# Patient Record
Sex: Female | Born: 1995 | Hispanic: Yes | Smoking: Never smoker
Health system: Southern US, Community
[De-identification: ages and names within clinical notes are randomized; demographics above are authoritative.]

## PROBLEM LIST (undated history)

## (undated) DIAGNOSIS — Z789 Other specified health status: Secondary | ICD-10-CM

## (undated) HISTORY — PX: NO PAST SURGERIES: SHX2092

---

## 2015-08-19 ENCOUNTER — Other Ambulatory Visit (HOSPITAL_COMMUNITY): Payer: Self-pay | Admitting: Nurse Practitioner

## 2015-08-19 DIAGNOSIS — Z3682 Encounter for antenatal screening for nuchal translucency: Secondary | ICD-10-CM

## 2015-08-19 DIAGNOSIS — Z3A12 12 weeks gestation of pregnancy: Secondary | ICD-10-CM

## 2015-09-14 ENCOUNTER — Ambulatory Visit (HOSPITAL_COMMUNITY)
Admission: RE | Admit: 2015-09-14 | Discharge: 2015-09-14 | Disposition: A | Discharge status: Home / Self Care | Payer: Medicaid Other | Source: Ambulatory Visit | Attending: Nurse Practitioner | Admitting: Nurse Practitioner

## 2015-09-14 ENCOUNTER — Encounter (HOSPITAL_COMMUNITY): Payer: Self-pay

## 2015-09-14 VITALS — BP 115/60 | HR 65 | Wt 119.6 lb

## 2015-09-14 DIAGNOSIS — Z3682 Encounter for antenatal screening for nuchal translucency: Secondary | ICD-10-CM

## 2015-09-14 DIAGNOSIS — Z36 Encounter for antenatal screening of mother: Secondary | ICD-10-CM | POA: Diagnosis present

## 2015-09-14 DIAGNOSIS — Z3689 Encounter for other specified antenatal screening: Secondary | ICD-10-CM

## 2015-09-14 DIAGNOSIS — Z3A12 12 weeks gestation of pregnancy: Secondary | ICD-10-CM | POA: Diagnosis not present

## 2015-09-22 ENCOUNTER — Other Ambulatory Visit (HOSPITAL_COMMUNITY): Payer: Self-pay | Admitting: Nurse Practitioner

## 2015-10-19 ENCOUNTER — Ambulatory Visit (HOSPITAL_COMMUNITY): Admission: RE | Admit: 2015-10-19 | Payer: Medicaid Other | Source: Ambulatory Visit

## 2016-02-20 ENCOUNTER — Inpatient Hospital Stay (HOSPITAL_COMMUNITY): Payer: Medicaid Other | Admitting: Anesthesiology

## 2016-02-20 ENCOUNTER — Encounter (HOSPITAL_COMMUNITY): Payer: Self-pay

## 2016-02-20 ENCOUNTER — Inpatient Hospital Stay (HOSPITAL_COMMUNITY)
Admission: AD | Admit: 2016-02-20 | Discharge: 2016-02-22 | DRG: 774 | Disposition: A | Discharge status: Home / Self Care | Payer: Medicaid Other | Source: Ambulatory Visit | Attending: Obstetrics and Gynecology | Admitting: Obstetrics and Gynecology

## 2016-02-20 DIAGNOSIS — Z3A35 35 weeks gestation of pregnancy: Secondary | ICD-10-CM

## 2016-02-20 DIAGNOSIS — O458X3 Other premature separation of placenta, third trimester: Secondary | ICD-10-CM | POA: Diagnosis present

## 2016-02-20 DIAGNOSIS — Z23 Encounter for immunization: Secondary | ICD-10-CM | POA: Diagnosis not present

## 2016-02-20 HISTORY — DX: Other specified health status: Z78.9

## 2016-02-20 LAB — TYPE AND SCREEN
ABO/RH(D): O POS
Antibody Screen: NEGATIVE

## 2016-02-20 LAB — ABO/RH: ABO/RH(D): O POS

## 2016-02-20 LAB — CBC
HEMATOCRIT: 34 % — AB (ref 36.0–46.0)
Hemoglobin: 11.3 g/dL — ABNORMAL LOW (ref 12.0–15.0)
MCH: 27.8 pg (ref 26.0–34.0)
MCHC: 33.2 g/dL (ref 30.0–36.0)
MCV: 83.7 fL (ref 78.0–100.0)
PLATELETS: 324 10*3/uL (ref 150–400)
RBC: 4.06 MIL/uL (ref 3.87–5.11)
RDW: 13.5 % (ref 11.5–15.5)
WBC: 12.9 10*3/uL — ABNORMAL HIGH (ref 4.0–10.5)

## 2016-02-20 LAB — GROUP B STREP BY PCR: GROUP B STREP BY PCR: NEGATIVE

## 2016-02-20 MED ORDER — OXYTOCIN BOLUS FROM INFUSION
500.0000 mL | INTRAVENOUS | Status: DC
  Administered 2016-02-20: 500 mL via INTRAVENOUS

## 2016-02-20 MED ORDER — PHENYLEPHRINE 40 MCG/ML (10ML) SYRINGE FOR IV PUSH (FOR BLOOD PRESSURE SUPPORT): 80.0000 ug | PREFILLED_SYRINGE | INTRAVENOUS | Status: DC | PRN

## 2016-02-20 MED ORDER — ONDANSETRON HCL 4 MG/2ML IJ SOLN: 4.0000 mg | Freq: Four times a day (QID) | INTRAMUSCULAR | Status: DC | PRN

## 2016-02-20 MED ORDER — LANOLIN HYDROUS EX OINT: TOPICAL_OINTMENT | CUTANEOUS | Status: DC | PRN

## 2016-02-20 MED ORDER — OXYCODONE-ACETAMINOPHEN 5-325 MG PO TABS: 1.0000 | ORAL_TABLET | ORAL | Status: DC | PRN

## 2016-02-20 MED ORDER — PHENYLEPHRINE 40 MCG/ML (10ML) SYRINGE FOR IV PUSH (FOR BLOOD PRESSURE SUPPORT)
PREFILLED_SYRINGE | INTRAVENOUS | Status: AC
  Filled 2016-02-20: qty 10

## 2016-02-20 MED ORDER — ONDANSETRON HCL 4 MG/2ML IJ SOLN: 4.0000 mg | INTRAMUSCULAR | Status: DC | PRN

## 2016-02-20 MED ORDER — SODIUM CHLORIDE 0.9 % IV SOLN
2.0000 g | Freq: Once | INTRAVENOUS | Status: AC
  Administered 2016-02-20: 2 g via INTRAVENOUS
  Filled 2016-02-20: qty 2000

## 2016-02-20 MED ORDER — HYDROXYZINE HCL 50 MG PO TABS
50.0000 mg | ORAL_TABLET | Freq: Four times a day (QID) | ORAL | Status: DC | PRN
  Filled 2016-02-20: qty 1

## 2016-02-20 MED ORDER — FENTANYL CITRATE (PF) 100 MCG/2ML IJ SOLN
50.0000 ug | INTRAMUSCULAR | Status: DC | PRN
  Administered 2016-02-20: 100 ug via INTRAVENOUS
  Filled 2016-02-20: qty 2

## 2016-02-20 MED ORDER — SODIUM CHLORIDE 0.9% FLUSH: 3.0000 mL | INTRAVENOUS | Status: DC | PRN

## 2016-02-20 MED ORDER — OXYTOCIN 10 UNIT/ML IJ SOLN
2.5000 [IU]/h | INTRAVENOUS | Status: DC
  Administered 2016-02-20: 2.5 [IU]/h via INTRAVENOUS
  Filled 2016-02-20: qty 10

## 2016-02-20 MED ORDER — LIDOCAINE HCL (PF) 1 % IJ SOLN
INTRAMUSCULAR | Status: DC | PRN
  Administered 2016-02-20: 2 mL
  Administered 2016-02-20: 3 mL
  Administered 2016-02-20: 5 mL

## 2016-02-20 MED ORDER — EPHEDRINE 5 MG/ML INJ: 10.0000 mg | INTRAVENOUS | Status: DC | PRN

## 2016-02-20 MED ORDER — VITAMIN K1 1 MG/0.5ML IJ SOLN
INTRAMUSCULAR | Status: AC
  Filled 2016-02-20: qty 0.5

## 2016-02-20 MED ORDER — METHYLERGONOVINE MALEATE 0.2 MG PO TABS
0.2000 mg | ORAL_TABLET | Freq: Four times a day (QID) | ORAL | Status: AC
  Administered 2016-02-20 – 2016-02-21 (×4): 0.2 mg via ORAL
  Filled 2016-02-20 (×4): qty 1

## 2016-02-20 MED ORDER — SODIUM CHLORIDE 0.9 % IV SOLN: 250.0000 mL | INTRAVENOUS | Status: DC | PRN

## 2016-02-20 MED ORDER — DIPHENHYDRAMINE HCL 25 MG PO CAPS: 25.0000 mg | ORAL_CAPSULE | Freq: Four times a day (QID) | ORAL | Status: DC | PRN

## 2016-02-20 MED ORDER — SODIUM CHLORIDE 0.9% FLUSH: 3.0000 mL | Freq: Two times a day (BID) | INTRAVENOUS | Status: DC

## 2016-02-20 MED ORDER — LACTATED RINGERS IV SOLN: 500.0000 mL | Freq: Once | INTRAVENOUS | Status: DC

## 2016-02-20 MED ORDER — EPHEDRINE 5 MG/ML INJ
10.0000 mg | INTRAVENOUS | Status: DC | PRN
  Filled 2016-02-20: qty 2

## 2016-02-20 MED ORDER — DIBUCAINE 1 % RE OINT: 1.0000 "application " | TOPICAL_OINTMENT | RECTAL | Status: DC | PRN

## 2016-02-20 MED ORDER — ZOLPIDEM TARTRATE 5 MG PO TABS: 5.0000 mg | ORAL_TABLET | Freq: Every evening | ORAL | Status: DC | PRN

## 2016-02-20 MED ORDER — FENTANYL 2.5 MCG/ML BUPIVACAINE 1/10 % EPIDURAL INFUSION (WH - ANES)
14.0000 mL/h | INTRAMUSCULAR | Status: DC | PRN
  Administered 2016-02-20 (×2): 14 mL/h via EPIDURAL

## 2016-02-20 MED ORDER — SIMETHICONE 80 MG PO CHEW: 80.0000 mg | CHEWABLE_TABLET | ORAL | Status: DC | PRN

## 2016-02-20 MED ORDER — DIPHENHYDRAMINE HCL 50 MG/ML IJ SOLN: 12.5000 mg | INTRAMUSCULAR | Status: DC | PRN

## 2016-02-20 MED ORDER — BENZOCAINE-MENTHOL 20-0.5 % EX AERO: 1.0000 "application " | INHALATION_SPRAY | CUTANEOUS | Status: DC | PRN

## 2016-02-20 MED ORDER — OXYTOCIN 10 UNIT/ML IJ SOLN: 10.0000 [IU] | Freq: Once | INTRAMUSCULAR | Status: DC

## 2016-02-20 MED ORDER — SENNOSIDES-DOCUSATE SODIUM 8.6-50 MG PO TABS
2.0000 | ORAL_TABLET | ORAL | Status: DC
  Administered 2016-02-20 – 2016-02-21 (×2): 2 via ORAL
  Filled 2016-02-20 (×2): qty 2

## 2016-02-20 MED ORDER — BETAMETHASONE SOD PHOS & ACET 6 (3-3) MG/ML IJ SUSP
12.0000 mg | Freq: Once | INTRAMUSCULAR | Status: DC
Start: 2016-02-21 — End: 2016-02-20
  Filled 2016-02-20: qty 2

## 2016-02-20 MED ORDER — FENTANYL 2.5 MCG/ML BUPIVACAINE 1/10 % EPIDURAL INFUSION (WH - ANES)
INTRAMUSCULAR | Status: AC
  Filled 2016-02-20: qty 125

## 2016-02-20 MED ORDER — OXYCODONE-ACETAMINOPHEN 5-325 MG PO TABS: 2.0000 | ORAL_TABLET | ORAL | Status: DC | PRN

## 2016-02-20 MED ORDER — TETANUS-DIPHTH-ACELL PERTUSSIS 5-2.5-18.5 LF-MCG/0.5 IM SUSP: 0.5000 mL | Freq: Once | INTRAMUSCULAR | Status: DC

## 2016-02-20 MED ORDER — CITRIC ACID-SODIUM CITRATE 334-500 MG/5ML PO SOLN: 30.0000 mL | ORAL | Status: DC | PRN

## 2016-02-20 MED ORDER — LIDOCAINE HCL (PF) 1 % IJ SOLN
30.0000 mL | INTRAMUSCULAR | Status: DC | PRN
  Filled 2016-02-20: qty 30

## 2016-02-20 MED ORDER — ACETAMINOPHEN 325 MG PO TABS: 650.0000 mg | ORAL_TABLET | ORAL | Status: DC | PRN

## 2016-02-20 MED ORDER — WITCH HAZEL-GLYCERIN EX PADS: 1.0000 "application " | MEDICATED_PAD | CUTANEOUS | Status: DC | PRN

## 2016-02-20 MED ORDER — SODIUM CHLORIDE 0.9 % IV SOLN
1.0000 g | INTRAVENOUS | Status: DC
  Administered 2016-02-20: 1 g via INTRAVENOUS
  Filled 2016-02-20 (×3): qty 1000

## 2016-02-20 MED ORDER — PRENATAL MULTIVITAMIN CH
1.0000 | ORAL_TABLET | Freq: Every day | ORAL | Status: DC
  Administered 2016-02-21 – 2016-02-22 (×2): 1 via ORAL
  Filled 2016-02-20 (×2): qty 1

## 2016-02-20 MED ORDER — BETAMETHASONE SOD PHOS & ACET 6 (3-3) MG/ML IJ SUSP
12.0000 mg | Freq: Once | INTRAMUSCULAR | Status: AC
  Administered 2016-02-20: 12 mg via INTRAMUSCULAR
  Filled 2016-02-20: qty 2

## 2016-02-20 MED ORDER — LACTATED RINGERS IV SOLN
INTRAVENOUS | Status: DC
  Administered 2016-02-20: 125 mL/h via INTRAVENOUS
  Administered 2016-02-20: 11:00:00 via INTRAVENOUS

## 2016-02-20 MED ORDER — IBUPROFEN 600 MG PO TABS
600.0000 mg | ORAL_TABLET | Freq: Four times a day (QID) | ORAL | Status: DC
  Administered 2016-02-20 – 2016-02-22 (×7): 600 mg via ORAL
  Filled 2016-02-20 (×7): qty 1

## 2016-02-20 MED ORDER — LACTATED RINGERS IV SOLN: 500.0000 mL | INTRAVENOUS | Status: DC | PRN

## 2016-02-20 MED ORDER — ONDANSETRON HCL 4 MG PO TABS: 4.0000 mg | ORAL_TABLET | ORAL | Status: DC | PRN

## 2016-02-20 MED ORDER — PHENYLEPHRINE 40 MCG/ML (10ML) SYRINGE FOR IV PUSH (FOR BLOOD PRESSURE SUPPORT)
80.0000 ug | PREFILLED_SYRINGE | INTRAVENOUS | Status: DC | PRN
  Filled 2016-02-20: qty 2

## 2016-02-20 NOTE — Anesthesia Procedure Notes (Signed)
Epidural Patient location during procedure: OB  Staffing Anesthesiologist: Marcene DuosFITZGERALD, Marilyn Johnson Performed by: anesthesiologist   Preanesthetic Checklist Completed: patient identified, site marked, surgical consent, pre-op evaluation, timeout performed, IV checked, risks and benefits discussed and monitors and equipment checked  Epidural Patient position: sitting Prep: site prepped and draped and DuraPrep Patient monitoring: continuous pulse ox and blood pressure Approach: midline Location: L4-L5 Injection technique: LOR saline  Needle:  Needle type: Tuohy  Needle gauge: 17 G Needle length: 9 cm and 9 Needle insertion depth: 6 cm Catheter type: closed end flexible Catheter size: 19 Gauge Catheter at skin depth: 12 (11cm initially at the skin. Moved to 12cm after pt laid in left lat decubitus position.) cm Test dose: negative  Assessment Events: blood not aspirated, injection not painful, no injection resistance, negative IV test and no paresthesia

## 2016-02-20 NOTE — Progress Notes (Signed)
Patient ID: Marilyn Johnson, female   DOB: [birth year], 20 y.o.   MRN: [id number] Operative Delivery Note At 6:42 PM a viable and healthy female was delivered via .  Presentation: vertex; Position: Occiput,, Posterior; Station: +2.  Verbal consent: obtained from patient.  Risks and benefits discussed in detail.  Risks include, but are not limited to the risks of anesthesia, bleeding, infection, damage to maternal tissues, fetal cephalhematoma.  There is also the risk of inability to effect vaginal delivery of the head, or shoulder dystocia that cannot be resolved by established maneuvers, leading to the need for emergency cesarean section.  APGAR: 7/9, ; weight  .   Placenta status: Intact, Spontaneous.   Cord: 3 vessels with the following complications: None.  Cord pH: not done   Anesthesia: Epidural  Instruments: Kiwi tried initially , wouldn't seal well, so Bell suction cup used without popoff, over 2 contractions, resulting in delivery to crowning position, then Cup removed and pt spontaneously expelled the vertex the last bit while the fetal vertex rotated and the chin delivered over the perineal body. The head then rotated back to occiput posterior, shoulder dystocia which was relieved by identifying the left shoulder which was anterior and could be extracted beneath the symphysis pubis without difficulty and then the rest the baby delivered.. There was a greater than normal amount of old blood covering the baby's face during the delivery, suggestive of a partial abruption.\The infant was passed to waiting pediatricians. There had been a nuchal cord 1. Pediatrician's notes for further details. Apgars were 7 and 9 and the baby did well, and there was returned to the mother for skin to skin care.\ Placenta delivered easily [place] presentation with the leading edge showing a small area of attached old clot consistent with marginal abruption total blood loss was 350 cc or less. Routine neonatal labs  were drawn blood gas was not considered necessary and declined by pediatrics   Episiotomy:   Lacerations:  none Suture Repair:  Est. Blood Loss (mL):  325-350  Mom to postpartum.  Baby to Nursery.  Marilyn Johnson V 02/20/2016, 6:55 PM

## 2016-02-20 NOTE — H&P (Signed)
Marilyn DienerCristian Johnson is a 20 y.o. G3P1101 @ 35.6 wks  female presenting for contractions that started last night and got progressively stronger this morning. . Maternal Medical History:  Reason for admission: Contractions.   Contractions: Onset was 13-24 hours ago.   Perceived severity is moderate.    Fetal activity: Perceived fetal activity is normal.   Last perceived fetal movement was within the past hour.    Prenatal complications: no prenatal complications Prenatal Complications - Diabetes: none.    OB History    Gravida Para Term Preterm AB TAB SAB Ectopic Multiple Living   3 1 1  0 1 0 0 0 0 2     Past Medical History  Diagnosis Date  . Medical history non-contributory    Past Surgical History  Procedure Laterality Date  . No past surgeries     Family History: family history is negative for Diabetes, Heart disease, and Cancer. Social History:  reports that she has never smoked. She has never used smokeless tobacco. She reports that she does not drink alcohol or use illicit drugs.   Prenatal Transfer Tool  Maternal Diabetes: No Genetic Screening: Normal Maternal Ultrasounds/Referrals: Normal Fetal Ultrasounds or other Referrals:  None Maternal Substance Abuse:  No Significant Maternal Medications:  None Significant Maternal Lab Results:  None Other Comments:  None  Review of Systems  Constitutional: Negative.   HENT: Negative.   Eyes: Negative.   Respiratory: Negative.   Cardiovascular: Negative.   Gastrointestinal: Positive for abdominal pain.  Genitourinary: Negative.   Musculoskeletal: Negative.   Skin: Negative.   Neurological: Negative.   Endo/Heme/Allergies: Negative.   Psychiatric/Behavioral: Negative.     Dilation: 3.5 Effacement (%): 100 Station: -1 Exam by:: [email or web address],rnc Blood pressure 114/79, pulse 73, temperature 98.3 F (36.8 C), temperature source Oral, resp. rate 18, height 4\' 10"  (1.473 m), weight 130 lb (58.968 kg). Maternal Exam:   Uterine Assessment: Contraction strength is mild.  Contraction frequency is regular.   Abdomen: Patient reports no abdominal tenderness. Fetal presentation: vertex  Introitus: Normal vulva. Pelvis: adequate for delivery.   Cervix: Cervix evaluated by digital exam.     Fetal Exam Fetal Monitor Review: Mode: ultrasound.   Variability: moderate (6-25 bpm).   Pattern: no accelerations.    Fetal State Assessment: Category I - tracings are normal.     Physical Exam  Constitutional: She is oriented to person, place, and time. She appears well-developed and well-nourished.  HENT:  Head: Normocephalic.  Cardiovascular: Normal rate, regular rhythm, normal heart sounds and intact distal pulses.   Respiratory: Effort normal and breath sounds normal.  GI: Soft.  Genitourinary: Vagina normal and uterus normal.  Musculoskeletal: Normal range of motion.  Neurological: She is alert and oriented to person, place, and time. She has normal reflexes.  Skin: Skin is warm and dry.  Psychiatric: She has a normal mood and affect. Her behavior is normal. Judgment and thought content normal.    Prenatal labs: ABO, Rh:   Antibody:   Rubella:   RPR:    HBsAg:    HIV:    GBS:     Assessment/Plan: SVE 3-4/100/-1  BMX, GBS obtained. Will start ampicillin . OK to admit given per NICU.   Marilyn Johnson, Marilyn Johnson 02/20/2016, 10:29 AM

## 2016-02-20 NOTE — Anesthesia Preprocedure Evaluation (Signed)
Anesthesia Evaluation  Patient identified by MRN, date of birth, ID band Patient awake    Reviewed: Allergy & Precautions, NPO status , Patient's Chart, lab work & pertinent test results  Airway Mallampati: II       Dental   Pulmonary neg pulmonary ROS,    breath sounds clear to auscultation       Cardiovascular negative cardio ROS   Rhythm:Regular Rate:Normal     Neuro/Psych negative neurological ROS     GI/Hepatic negative GI ROS, Neg liver ROS,   Endo/Other  negative endocrine ROS  Renal/GU negative Renal ROS     Musculoskeletal   Abdominal   Peds  Hematology negative hematology ROS (+)   Anesthesia Other Findings   Reproductive/Obstetrics (+) Pregnancy                             Lab Results  Component Value Date   WBC 12.9* 02/20/2016   HGB 11.3* 02/20/2016   HCT 34.0* 02/20/2016   MCV 83.7 02/20/2016   PLT 324 02/20/2016   No results found for: CREATININE, BUN, NA, K, CL, CO2  Anesthesia Physical Anesthesia Plan  ASA: II  Anesthesia Plan: Epidural   Post-op Pain Management:    Induction:   Airway Management Planned: Natural Airway  Additional Equipment:   Intra-op Plan:   Post-operative Plan:   Informed Consent: I have reviewed the patients History and Physical, chart, labs and discussed the procedure including the risks, benefits and alternatives for the proposed anesthesia with the patient or authorized representative who has indicated his/her understanding and acceptance.     Plan Discussed with:   Anesthesia Plan Comments:         Anesthesia Quick Evaluation

## 2016-02-20 NOTE — MAU Note (Signed)
Pt c/o feeling contractions all through the night with them getting stronger this morning and occurring every 5 minutes. Pt states she has had some spotting this morning also. Pt denies leaking fluid. Pt states the baby is moving normally.

## 2016-02-20 NOTE — Progress Notes (Signed)
Pt repositioned with peanut in place.  Pt unable to push effectively ? OP position with infant will allow pt to labor down

## 2016-02-21 ENCOUNTER — Encounter (HOSPITAL_COMMUNITY): Payer: Self-pay | Admitting: *Deleted

## 2016-02-21 LAB — CBC
HEMATOCRIT: 32.7 % — AB (ref 36.0–46.0)
HEMOGLOBIN: 10.7 g/dL — AB (ref 12.0–15.0)
MCH: 27.6 pg (ref 26.0–34.0)
MCHC: 32.7 g/dL (ref 30.0–36.0)
MCV: 84.3 fL (ref 78.0–100.0)
Platelets: 320 10*3/uL (ref 150–400)
RBC: 3.88 MIL/uL (ref 3.87–5.11)
RDW: 13.6 % (ref 11.5–15.5)
WBC: 24.8 10*3/uL — ABNORMAL HIGH (ref 4.0–10.5)

## 2016-02-21 LAB — RPR: RPR: NONREACTIVE

## 2016-02-21 MED ORDER — MEASLES, MUMPS & RUBELLA VAC ~~LOC~~ INJ
0.5000 mL | INJECTION | Freq: Once | SUBCUTANEOUS | Status: AC
  Administered 2016-02-22: 0.5 mL via SUBCUTANEOUS
  Filled 2016-02-21 (×2): qty 0.5

## 2016-02-21 NOTE — Lactation Note (Signed)
This note was copied from a baby's chart. Lactation Consultation Note Experienced BF mom BF her now 1 1/21 yr old for 6 months. Baby is a LPI at 35 6/7 weeks. Mom states BF well. Encouraged mom since baby is 35 6/7 weeks and 5.9 lbs. Baby will need to be supplemented after every BF. Mom demonstrated hand expression. Has easy flow of colostrum. Gave vial to hand express in w/curve tip syring and foley cup. Instructed different ways to supplement. LPI information and LPI behavior reviewed. LPI information sheet given.  RN set up DEBP. Mom knows to pump q3h for 15-20 min. Mom shown how to use DEBP & how to disassemble, clean, & reassemble parts. Mom encouraged to do skin-to-skin. Mom encouraged to feed baby 8-12 times/24 hours and with feeding cues. Mom encouraged to waken baby for feeds. Stressed importance I&O, and feedings. Referred to Baby and Me Book in Breastfeeding section Pg. 22-23 for position options and Proper latch demonstration. WH/LC brochure given w/resources, support groups and LC services. Patient Name: Marilyn Johnson  .  Today's Date: 02/21/2016 Reason for consult: Initial assessment   Maternal Data Has patient been taught Hand Expression?: Yes Does the patient have breastfeeding experience prior to this delivery?: Yes  Feeding Feeding Type: Breast Fed Length of feed: 20 min  LATCH Score/Interventions                      Lactation Tools Discussed/Used Tools: Pump WIC Program: Yes Pump Review: Setup, frequency, and cleaning;Milk Storage Initiated by:: RN Date initiated:: 02/21/16   Consult Status Consult Status: Follow-up Date: 02/22/16 Follow-up type: In-patient    Charyl DancerCARVER, Marilyn Johnson 02/21/2016, 6:19 AM

## 2016-02-21 NOTE — Progress Notes (Signed)
UR chart review completed.  

## 2016-02-21 NOTE — Anesthesia Postprocedure Evaluation (Signed)
Anesthesia Post Note  Patient: Marilyn DienerCristian Joslin  Procedure(s) Performed: * No procedures listed *  Patient location during evaluation: Mother Baby Anesthesia Type: Epidural Level of consciousness: awake, awake and alert and oriented Pain management: pain level controlled Vital Signs Assessment: post-procedure vital signs reviewed and stable Respiratory status: spontaneous breathing Cardiovascular status: stable Postop Assessment: patient able to bend at knees, no signs of nausea or vomiting and adequate PO intake Anesthetic complications: no    Last Vitals:  Filed Vitals:   02/21/16 0610 02/21/16 0742  BP: 111/62 116/68  Pulse: 64 68  Temp: 36.8 C 36.7 C  Resp: 20 16    Last Pain:  Filed Vitals:   02/21/16 0743  PainSc: 0-No pain                 Leah Thornberry Hristova

## 2016-02-21 NOTE — Progress Notes (Signed)
Head circumference of infant noted to be 1135 in in delivery record. Remeasured per Dr. Carlean JewsNagappan's request, in his presence. Head circumference 12.5 inches, entered in delivery record

## 2016-02-21 NOTE — Progress Notes (Signed)
Patient declined Flu vaccine

## 2016-02-21 NOTE — Lactation Note (Signed)
This note was copied from a baby's chart. Lactation Consultation Note  Patient Name: Marilyn Johnson WUJWJ'XToday's Date: 02/21/2016 Reason for consult: Follow-up assessment;Infant < 6lbs;Late preterm infant   Follow up with mom of 21 hour old LPT infant. Infant with 7 BF for 10-20 minutes, 2 attempts, 0 voids and 3 stools in last 24 hours. Mom has given infant 5 cc EBM via finger feeding and curved tip syringe. LATCH Scores 7-10 by bedside RN. Infant weight 5 lb 9.6 oz with 0% weight loss since delivery.  Mom has pumped a few times followed by hand expression. Enc mom to pump every 2-3 hours for 15 minutes with DEBP on Initiate setting post BF and follow with hand expression. Enc mom to give any EBM obtained to infant post BF via finger feeding/curved tip syringe or spoon. Mom is aware of LPT infant policy and need for infant to have frequent feeds. LPT infant policy is at mom bedside.   Infant was feeding when I arrived in room, he was positioned far away from the breast in the football hold and mom kept pulling back on nipple tissue, repositioned him to bring him in closer for deeper latch and discussed positioning infant to allow for breathing space. Mom's nipple was compressed when infant was taken off to reposition, discussed deep latch to maximize milk transfer and to prevent nipple trauma.  Enc mom to call for assistance as needed. Spoke with Mom's RN Patty reports mom has had a hard time getting infant latched today due to sleepiness. Discussed need to keep infant awake at breast while feeding with mom.   Enc parents to maintain feeding log as they have not been documenting feedings. Flowsheet updated per mom on todays feeds.    Maternal Data Formula Feeding for Exclusion: No Has patient been taught Hand Expression?: Yes Does the patient have breastfeeding experience prior to this delivery?: Yes  Feeding Feeding Type: Breast Fed Length of feed: 10 min  LATCH  Score/Interventions Latch: Grasps breast easily, tongue down, lips flanged, rhythmical sucking.  Audible Swallowing: Spontaneous and intermittent Intervention(s): Skin to skin  Type of Nipple: Everted at rest and after stimulation  Comfort (Breast/Nipple): Soft / non-tender     Hold (Positioning): Assistance needed to correctly position infant at breast and maintain latch. Intervention(s): Breastfeeding basics reviewed;Support Pillows;Skin to skin  LATCH Score: 9  Lactation Tools Discussed/Used Tools: Other (comment) (finger feeding/curved tip syringe) Pump Review: Setup, frequency, and cleaning   Consult Status Consult Status: Follow-up Date: 02/22/16 Follow-up type: In-patient    Marilyn Johnson 02/21/2016, 3:49 PM

## 2016-02-21 NOTE — Progress Notes (Signed)
Patient ID: Marilyn Johnson, female   DOB: [birth year], 20 y.o.   MRN: [id number]  PPD#1  Subjective: Marilyn Johnson is a 20 y.o. [id number] PPD#1 s/p VAVD at 6250w6d. No acute events overnight. Pt denies problems with eating, drinking, voiding, ambulating. She has not had bowel movement.Pain is well controlled with ibuprofen. Lochia is appropriate. She had a boy and declined circumcision. She plans on breastfeeding and possible supplementation with formula. She is considering Depo-Provera for birth control.  Objective: BP 111/62 mmHg  Pulse 64  Temp(Src) 98.3 F (36.8 C) (Oral)  Resp 20  Wt 58.968 kg (130 lb)  Breastfeeding? Yes  3/20 05:05 Hct 32.7 Hb 10.7  Physical Exam:  General: alert, cooperative and no distress Lochia: appropriate Chest: CTAB Heart: RRR Abdomen: soft, nontender, fundus firm DVT Evaluation: no calf tenderness, Homan's sign negative Extremities: no edema  Assessment / Plan: Marilyn Johnson is a 20 y.o. [id number] PPD#1 s/p VAVD at 550w6d, doing well.   -Follow-up prenatal labs -Plan to discharge when pediatrics approves  Felton ClintonKali Xu, Med Student 02/16/2016, 7:22 AM

## 2016-02-22 ENCOUNTER — Ambulatory Visit: Payer: Self-pay

## 2016-02-22 MED ORDER — IBUPROFEN 600 MG PO TABS: 600.0000 mg | ORAL_TABLET | Freq: Four times a day (QID) | ORAL | Status: AC

## 2016-02-22 NOTE — Discharge Summary (Signed)
OB Discharge Summary     Patient Name: Marilyn Johnson DOB: 1996/10/07 MRN: [id number]  Date of admission: 02/20/2016 Delivering MD: Tilda BurrowFERGUSON, JOHN V   Date of discharge: 02/22/2016  Admitting diagnosis: 35wks, Bleeding, CTX Intrauterine pregnancy: 5172w6d     Secondary diagnosis:  Active Problems:   Preterm labor   Vacuum extractor delivery, delivered  Additional problems: none     Discharge diagnosis: Preterm Pregnancy Delivered                                                                                                Post partum procedures:none  Augmentation: none  Complications: None  Hospital course:  Onset of Labor With Vaginal Delivery     20 y.o. yo [id number] at 3872w6d was admitted in Active Labor on 02/20/2016. Patient had an uncomplicated labor course as follows:  Membrane Rupture Time/Date: 4:26 PM ,02/20/2016   Intrapartum Procedures: Episiotomy: None [1]                                         Lacerations:  None [1]  Patient had a delivery of a Viable infant. 02/20/2016  Information for the patient's newborn:  Julaine Huaicado, Boy Alazay [[id number]][[id number]]  Delivery Method: Vaginal, Spontaneous Delivery (Filed from Delivery Summary)    Pateint had an uncomplicated postpartum course.  She is ambulating, tolerating a regular diet, passing flatus, and urinating well. Patient is discharged home in stable condition on 02/22/2016.    Physical exam  Filed Vitals:   02/21/16 0742 02/21/16 0900 02/21/16 1756 02/22/16 0552  BP: 116/68 114/70 95/57 102/53  Pulse: 68 61 55 94  Temp: 98.1 F (36.7 C) 98 F (36.7 C) 99.7 F (37.6 C) 98.5 F (36.9 C)  TempSrc: Oral Oral Oral Oral  Resp: 16 18 18 20   Height:      Weight:      SpO2: 99% 98%     General: alert, cooperative and no distress Lochia: appropriate Uterine Fundus: firm Incision: Healing well with no significant drainage DVT Evaluation: No evidence of DVT seen on physical exam. Labs: Lab Results  Component Value  Date   WBC 24.8* 02/21/2016   HGB 10.7* 02/21/2016   HCT 32.7* 02/21/2016   MCV 84.3 02/21/2016   PLT 320 02/21/2016   No flowsheet data found.  Discharge instruction: per After Visit Summary and "Baby and Me Booklet".  After visit meds:    Medication List    TAKE these medications        ibuprofen 600 MG tablet  Commonly known as:  ADVIL,MOTRIN  Take 1 tablet (600 mg total) by mouth every 6 (six) hours.     prenatal multivitamin Tabs tablet  Take 1 tablet by mouth daily at 12 noon.        Diet: routine diet  Activity: Advance as tolerated. Pelvic rest for 6 weeks.   Outpatient follow up:6 weeks Follow up Appt:No future appointments. Follow up Visit:No Follow-up on file.  Postpartum contraception: Depo Provera  Newborn Data:  Live born female  Birth Weight: 5 lb 9.6 oz (2540 g) APGAR: 7, 9  Baby Feeding: Breast Disposition:rooming in   02/22/2016 [facility], CNM

## 2016-02-22 NOTE — Lactation Note (Signed)
This note was copied from a baby's chart. Lactation Consultation Note Follow up visit at 47 hours of age.  Baby was delivered  1439w6d and now weight 5#7oz.  Mom is cup feeding baby 5mls of colostrum that she hand expressed.  Baby tolerated it well and still cueing.  Mom reports baby does not do as well on her left breast.  Breast is filling but compressible.  Baby latched in cross cradle hold with minimal support.  Baby had several audible swallows and needs stimulation to maintain feeding.  Mom aware to limit feeding to 30 minutes.  MOm does report some discomfort with latch, but its better that it has been.  Mom to call night [facility] score during the night and will call for assist as needed.     Patient Name: Marilyn Johnson ZOXWR'UToday's Date: 02/22/2016 Reason for consult: Follow-up assessment;Breast/nipple pain;Difficult latch;Infant < 6lbs;Late preterm infant   Maternal Data    Feeding Feeding Type: Breast Fed Length of feed:  (6 minutes obsserved)  LATCH Score/Interventions Latch: Grasps breast easily, tongue down, lips flanged, rhythmical sucking.  Audible Swallowing: A few with stimulation Intervention(s): Hand expression  Type of Nipple: Everted at rest and after stimulation  Comfort (Breast/Nipple): Filling, red/small blisters or bruises, mild/mod discomfort  Problem noted: Mild/Moderate discomfort  Hold (Positioning): Assistance needed to correctly position infant at breast and maintain latch. Intervention(s): Breastfeeding basics reviewed;Support Pillows;Position options  LATCH Score: 7  Lactation Tools Discussed/Used Tools: Feeding cup Breast pump type: Double-Electric Breast Pump   Consult Status Consult Status: Follow-up Date: 02/23/16 Follow-up type: In-patient    Marilyn Johnson, Marilyn Johnson 02/22/2016, 5:53 PM

## 2016-02-22 NOTE — Discharge Instructions (Signed)

## 2016-02-23 ENCOUNTER — Ambulatory Visit: Payer: Self-pay

## 2016-02-23 NOTE — Lactation Note (Signed)
This note was copied from a baby's chart. Lactation Consultation Note  Patient Name: Marilyn Johnson WUJWJ'XToday's Date: 02/23/2016 Reason for consult: Follow-up assessment Baby at 70 hr of life and mom wanted to know how much expressed breast milk to feed baby. She was not latching him because she was "sacred" of the phototherapy. She has been offering her pumped milk with a cup. Talked mom through latching baby then adjusting the lights around him. Given feeding amount chart. Encouraged mom to keep putting baby to breast and f/u with expressed milk after latching. She is aware of OP services and support group.    Maternal Data    Feeding Feeding Type: Breast Fed  LATCH Score/Interventions Latch: Repeated attempts needed to sustain latch, nipple held in mouth throughout feeding, stimulation needed to elicit sucking reflex. Intervention(s): Breast compression;Adjust position  Audible Swallowing: Spontaneous and intermittent Intervention(s): Skin to skin;Hand expression Intervention(s): Alternate breast massage  Type of Nipple: Everted at rest and after stimulation  Comfort (Breast/Nipple): Soft / non-tender     Hold (Positioning): No assistance needed to correctly position infant at breast. Intervention(s): Skin to skin;Position options;Support Pillows  LATCH Score: 9  Lactation Tools Discussed/Used     Consult Status Consult Status: Follow-up Date: 02/24/16 Follow-up type: In-patient    Rulon Eisenmengerlizabeth E Keanna Tugwell 02/23/2016, 5:38 PM

## 2016-02-23 NOTE — Lactation Note (Signed)
This note was copied from a baby's chart. Lactation Consultation Note  Baby under double phototherapy lights and 65 hours old. Mother recently hand expressed 70 ml of transitional breastmilk but states she has an area on her breast that is still firm and hurts. Noted knotty area on right out part of breast. Mother also states she has an abrasion on tip of L nipple and it hurts when she uses DEBP. Had mother show me how she pumps with #24 flange and suction setting.  Changed to #27 flange and adjusted suction until comfortable. Showed mother how to massage knotty section in breast while pumping with one flange and milk released and started flowing mother happy. Discussed engorgement care and praised mother for her efforts. Mother is supplementing after breastfeeding and to keep baby under lights is giving baby breastmilk with foley cup.  Patient Name: Marilyn Naoma DienerCristian Shedrick ZOXWR'UToday's Date: 02/23/2016 Reason for consult: Follow-up assessment   Maternal Data    Feeding Feeding Type: Breast Fed  LATCH Score/Interventions                      Lactation Tools Discussed/Used     Consult Status Consult Status: Follow-up Date: 02/24/16 Follow-up type: In-patient    Dahlia ByesBerkelhammer, [facility] Center[facility]os[facility] 02/23/2016, 12:27 PM

## 2016-02-24 ENCOUNTER — Ambulatory Visit: Payer: Self-pay

## 2016-02-24 NOTE — Lactation Note (Signed)
This note was copied from a baby's chart. L[facility]ation Consultation Note[facility] No questions at present. To call prn  Patient Name: Marilyn Johnson VHQIO'NToday's Date: 02/24/2016 Reason for consult: Follow-up assessment;Infant < 6lbs;Late preterm infant   Maternal Data Formula Feeding for Exclusion: No Does the patient have breastfeeding experience prior to this delivery?: Yes  Feeding   LATCH Score/Interventions                      Lactation Tools Discussed/Used WIC Program: Yes   Consult Status Consult Status: Follow-up Date: 02/24/16 Follow-up type: In-patient    Pamelia HoitWeeks, Depaul Arizpe D 02/24/2016, 10:26 AM

## 2016-02-24 NOTE — Lactation Note (Signed)
This note was copied from a baby's chart. Lactation Consultation Note; Experienced BF mom reports baby is doing better at breast feeding. Her milk supply is increasing. Mom has been pumping about q 3 hours. Reports baby last fed for 30 min and breast did feel softer after nursing. Has WIC- plans to call them about a pump. [facility] No questions at present  Patient Name: Marilyn Johnson Reason for consult: Follow-up assessment;Infant < 6lbs;Late preterm infant   Maternal Data Formula Feeding for Exclusion: No Does the patient have breastfeeding experience prior to this delivery?: Yes  Feeding    LATCH Score/Interventions                      Lactation Tools Discussed/Used WIC Program: Yes   Consult Status Consult Status: Follow-up Date: 02/24/16 Follow-up type: In-patient    Pamelia HoitWeeks, Dorota Heinrichs D Johnson, 9:21 AM

## 2016-06-06 IMAGING — US US MFM FETAL NUCHAL TRANSLUCENCY
1 series · 15 of 28 positions shown · non-contrast
Comparison: none

[Series 1: us mfm fetal nuchal translucency · 15 of 62 slices shown]
[im 1/62]
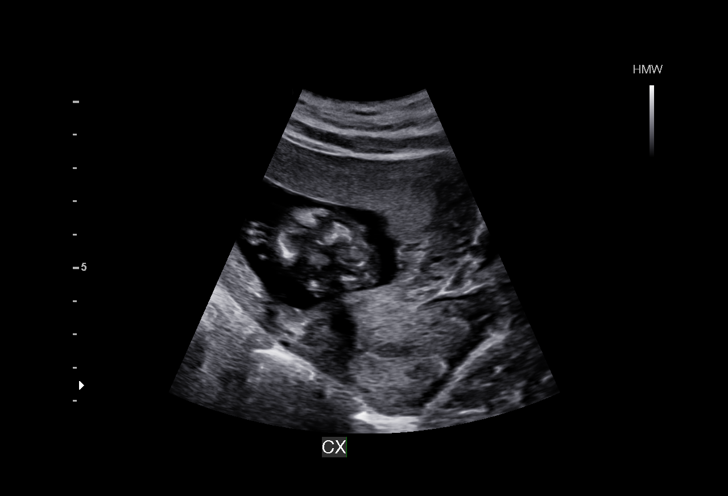
[im 5/62]
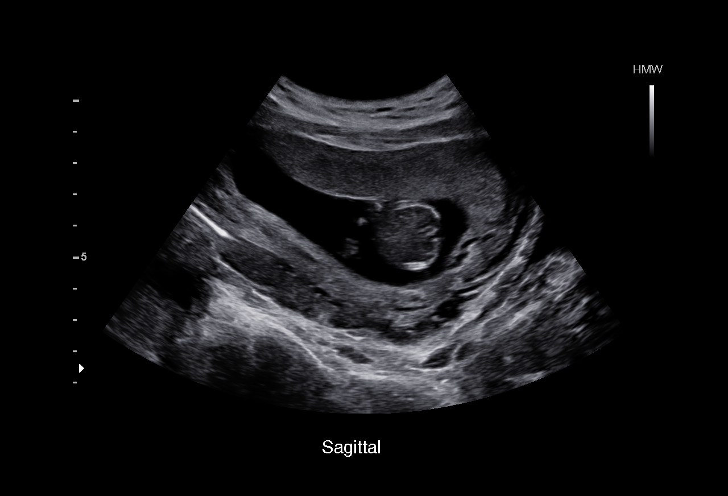
[im 10/62]
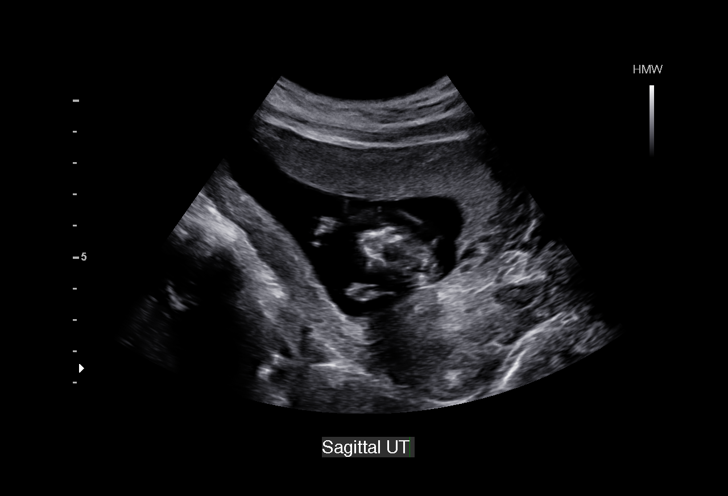
[im 14/62]
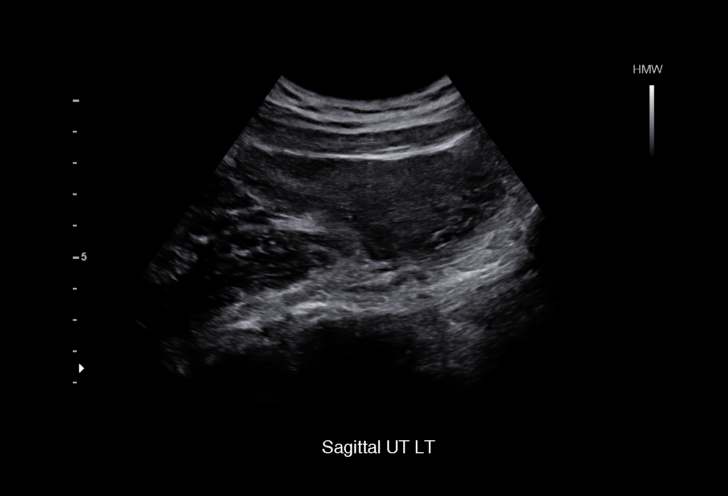
[im 19/62]
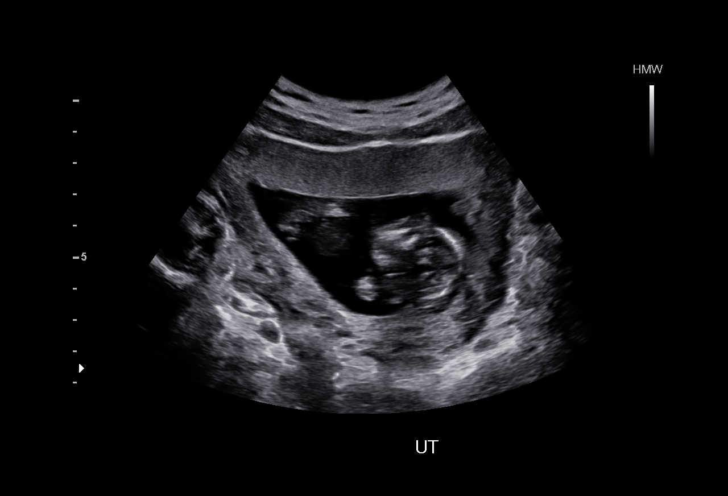
[im 23/62]
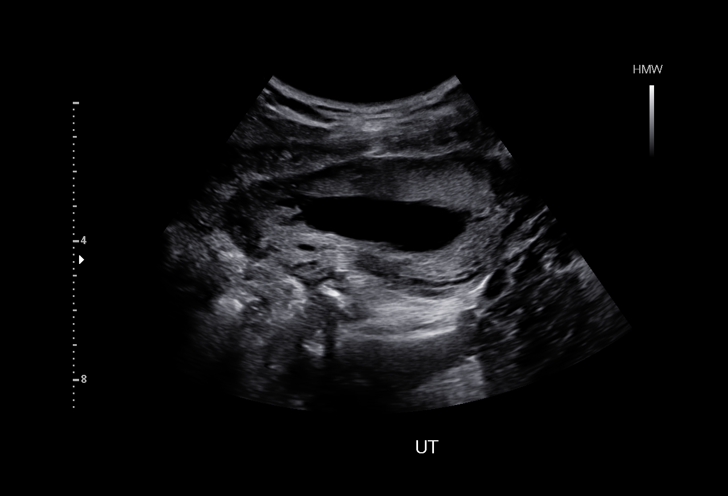
[im 28/62]
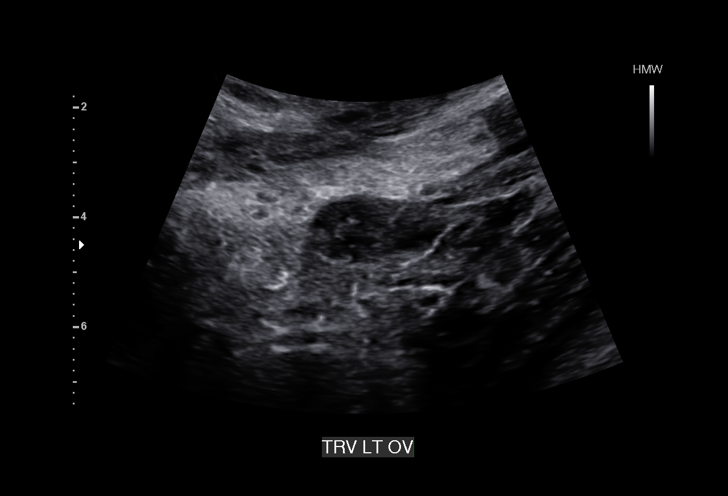
[im 32/62]
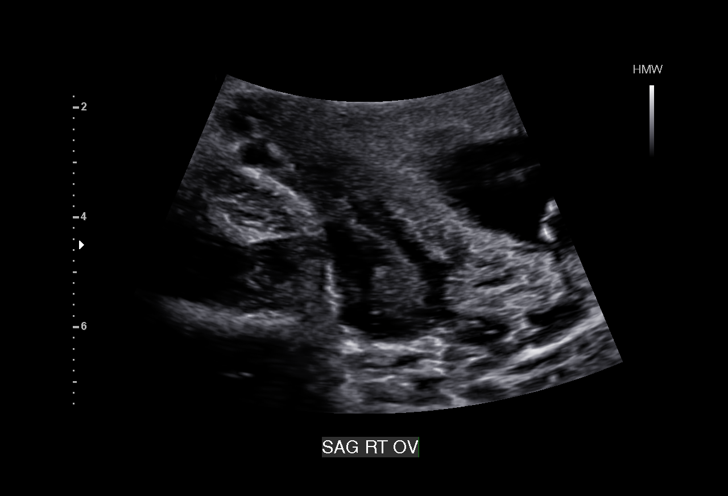
[im 34/62]
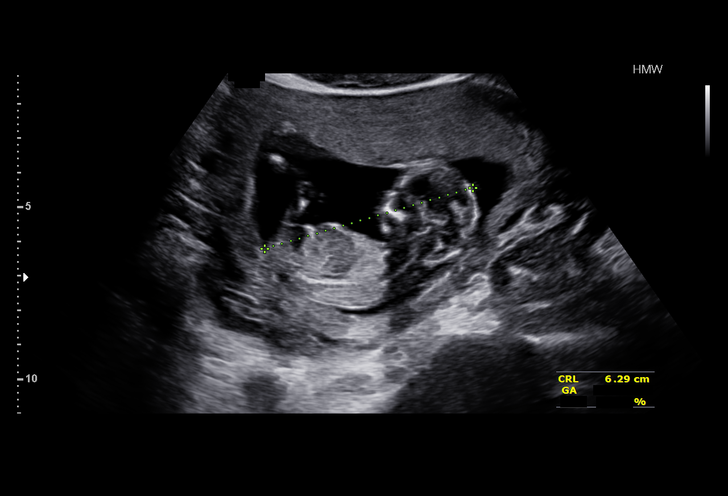
[im 39/62]
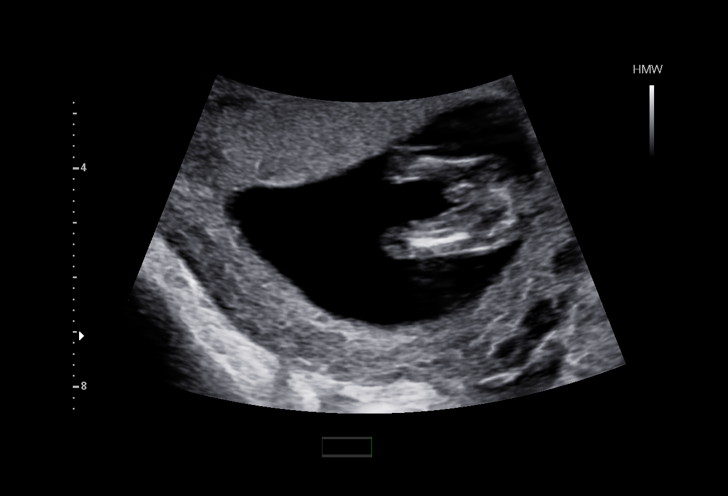
[im 43/62]
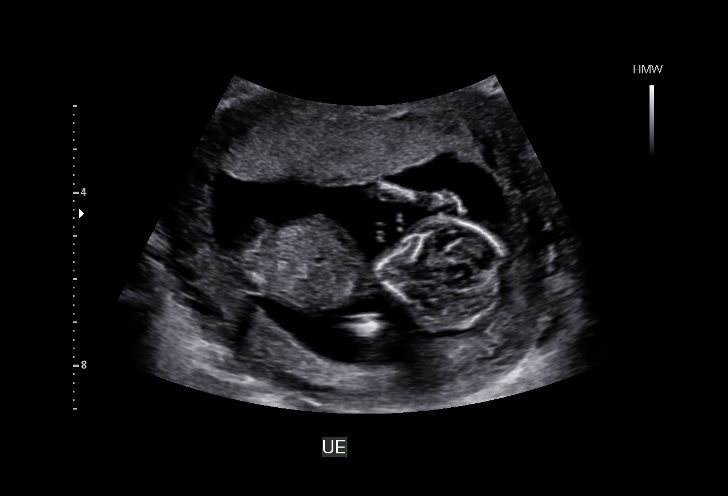
[im 48/62]
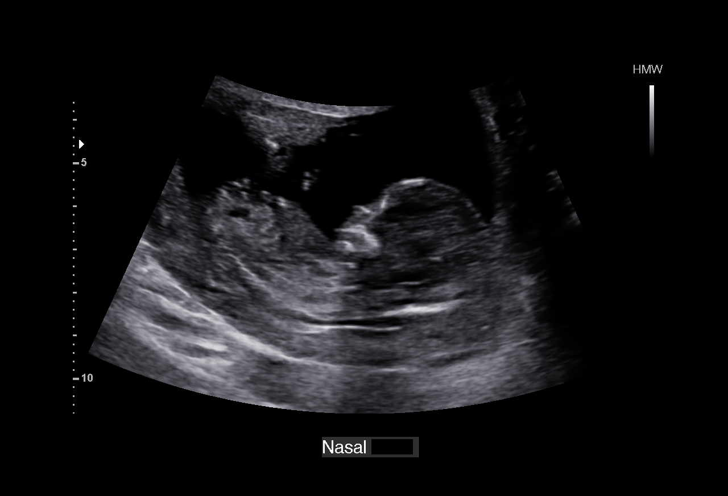
[im 52/62]
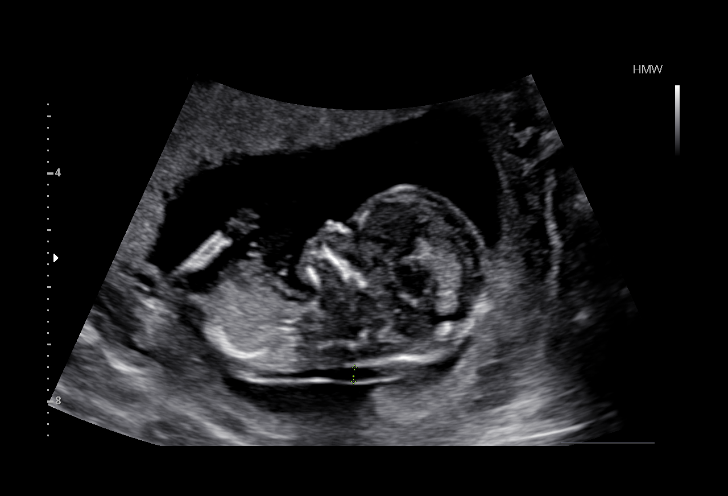
[im 57/62]
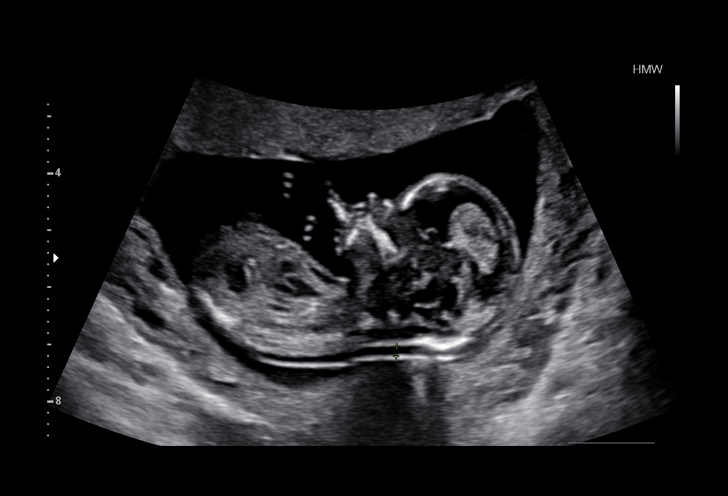
[im 62/62]
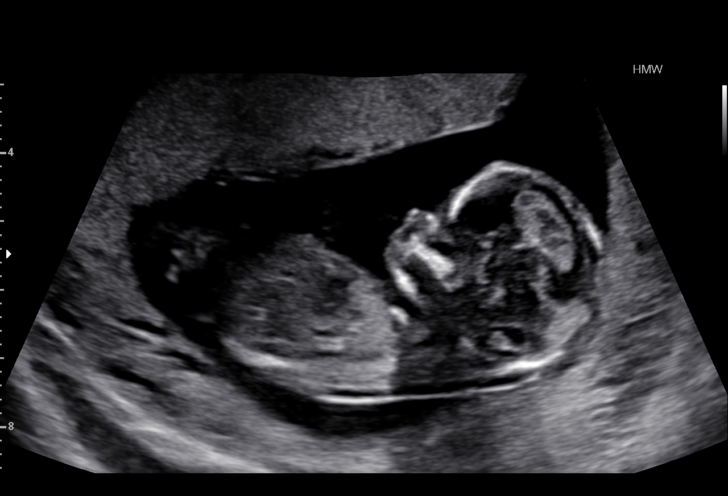

[15 of 28 positions shown; findings below may reference images not displayed]

OBSTETRICS REPORT
(Signed Final 09/14/2015 [DATE])

Name:       MARTYNIX PANDEL                       Visit  09/14/2015 [DATE]
Date:

Service(s) Provided

Indications

13 weeks gestation of pregnancy
First trimester aneuploidy screen (NT)                 Z36
Fetal Evaluation

Num Of             1
Fetuses:
Preg. Location:    Intrauterine
Gest. Sac:         Intrauterine
Yolk Sac:          Not visualized
Fetal Pole:        Visualized
Fetal Heart        141                          bpm
Rate:
Cardiac Activity:  Observed
Placenta:          Anterior
Biometry

CRL:     64.7   m    G. Age:   12w 5d                  EDD:   03/23/16
m

NT:        1.8  m
m
Gestational Age

Best:          13w 1d    Det. By:   Early Ultrasound          EDD:   03/20/16
Anatomy

Cranium:          Appears normal         Bladder:           Appears normal
Choroid Plexus:   Appears normal         Lower              Visualized
Extremities:
Stomach:          Appears normal,        Upper              Visualized
left sided             Extremities:
Cervix Uterus Adnexa
Cervix:       Normal appearance by transabdominal scan.
Uterus:       No abnormality visualized.
Cul De Sac:   No free fluid seen.

Left Ovary:    Within normal limits.
Right Ovary:   Within normal limits.

Adnexa:     No abnormality visualized.
Impression

SIUP at 13+1 weeks
No gross abnormalities identified
NT measurement was within normal limits for this GA; NB
present
Normal amniotic fluid volume
Measurements consistent with early US

First trimester screen for aneuploidy performed today.
Recommendations

Offer anatomy U/S by 18 weeks
Offer [facility]SA[facility] in the second trimester for ONTD screening
